# Patient Record
Sex: Female | Born: 1960 | Race: White | Hispanic: No
Health system: Southern US, Community
[De-identification: ages and names within clinical notes are randomized; demographics above are authoritative.]

---

## 2010-04-26 ENCOUNTER — Inpatient Hospital Stay: Payer: Self-pay | Admitting: Psychiatry

## 2010-05-31 ENCOUNTER — Emergency Department (HOSPITAL_COMMUNITY): Admission: EM | Admit: 2010-05-31 | Discharge: 2010-05-31 | Payer: Self-pay | Admitting: Emergency Medicine

## 2010-10-09 ENCOUNTER — Inpatient Hospital Stay: Payer: Self-pay | Admitting: Psychiatry

## 2010-10-21 LAB — URINALYSIS, ROUTINE W REFLEX MICROSCOPIC
Glucose, UA: NEGATIVE mg/dL
Protein, ur: NEGATIVE mg/dL
Specific Gravity, Urine: 1.012 (ref 1.005–1.030)
Urobilinogen, UA: 0.2 mg/dL (ref 0.0–1.0)

## 2010-10-21 LAB — DIFFERENTIAL
Eosinophils Absolute: 0.7 10*3/uL (ref 0.0–0.7)
Eosinophils Relative: 7 % — ABNORMAL HIGH (ref 0–5)
Monocytes Absolute: 1.2 10*3/uL — ABNORMAL HIGH (ref 0.1–1.0)

## 2010-10-21 LAB — BASIC METABOLIC PANEL
BUN: 15 mg/dL (ref 6–23)
Chloride: 105 mEq/L (ref 96–112)
Glucose, Bld: 105 mg/dL — ABNORMAL HIGH (ref 70–99)
Potassium: 3.9 mEq/L (ref 3.5–5.1)

## 2010-10-21 LAB — CBC
HCT: 39.6 % (ref 36.0–46.0)
MCH: 30.8 pg (ref 26.0–34.0)
MCHC: 32.6 g/dL (ref 30.0–36.0)
MCV: 94.5 fL (ref 78.0–100.0)
RDW: 14.1 % (ref 11.5–15.5)

## 2010-10-21 LAB — RAPID URINE DRUG SCREEN, HOSP PERFORMED
Amphetamines: NOT DETECTED
Barbiturates: NOT DETECTED
Benzodiazepines: NOT DETECTED

## 2010-10-21 LAB — URINE MICROSCOPIC-ADD ON

## 2011-02-07 DEATH — deceased

## 2011-08-29 IMAGING — CT CT HEAD WITHOUT CONTRAST
2 series · 16 of 30 positions shown, 20 images · non-contrast
Comparison: none

REASON FOR EXAM: altered mental status, possibly early dementia
COMMENTS:

PROCEDURE:     CT  - CT HEAD WITHOUT CONTRAST  - May 06, 2010 [DATE]
RESULT:
TECHNIQUE: Helical 5 mm sections were obtained from the skull base to the
vertex without the administration of intravenous contrast.

[Series 2: without · axial · non-contrast · 0.42mm/px · z∈[+1158,+1278]mm · 13 of 29 slices shown, 17 images]
[im 3/29  brain]
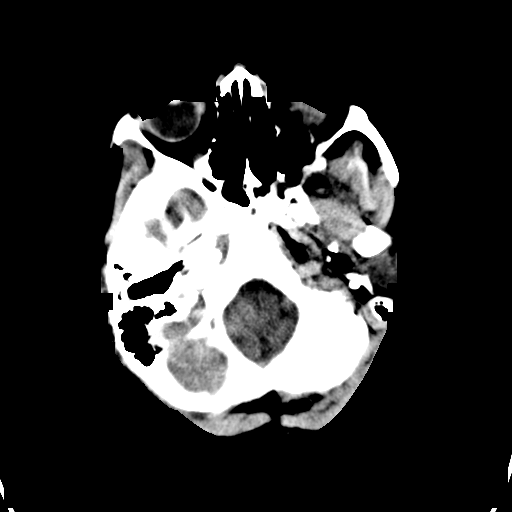
[im 3/29  bone]
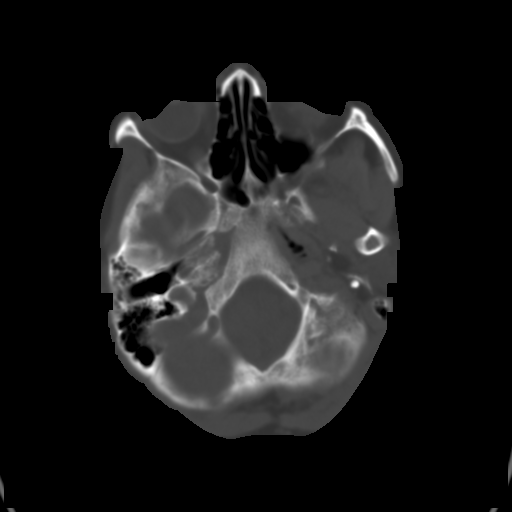
[im 5/29  brain]
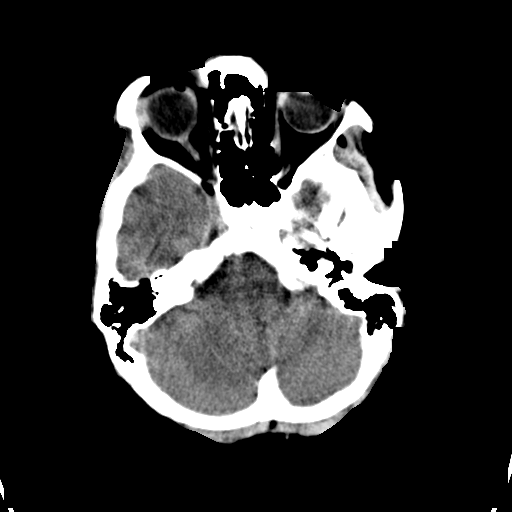
[im 7/29  brain]
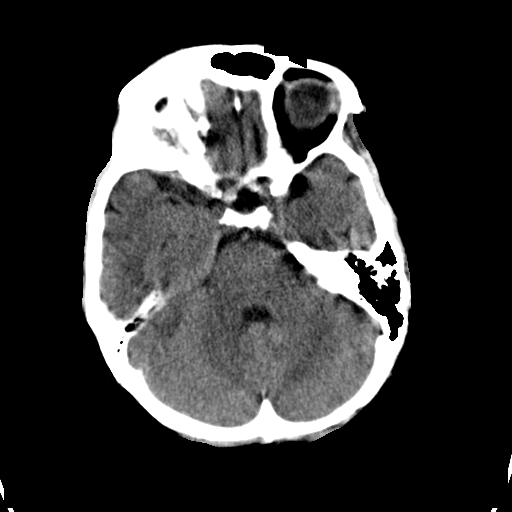
[im 9/29  brain]
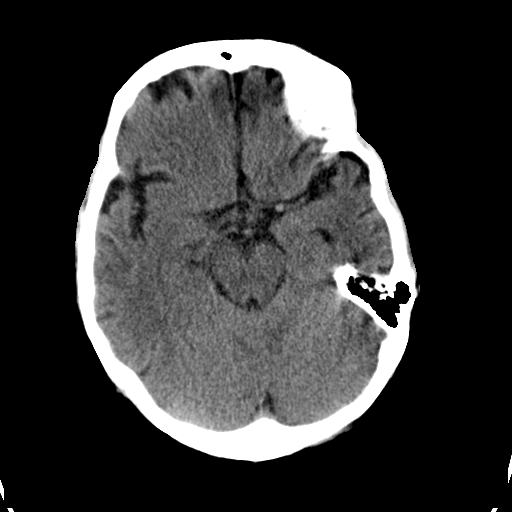
[im 11/29  brain]
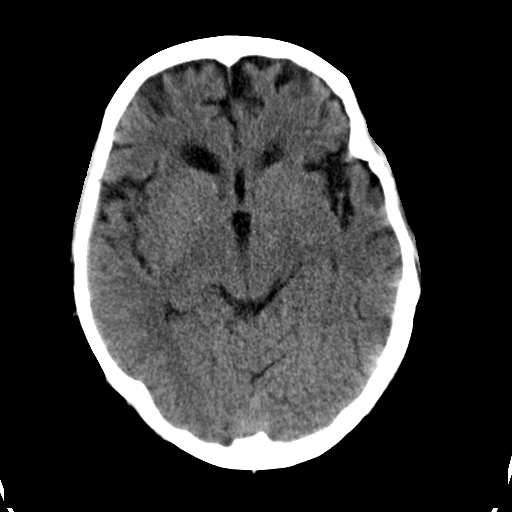
[im 11/29  bone]
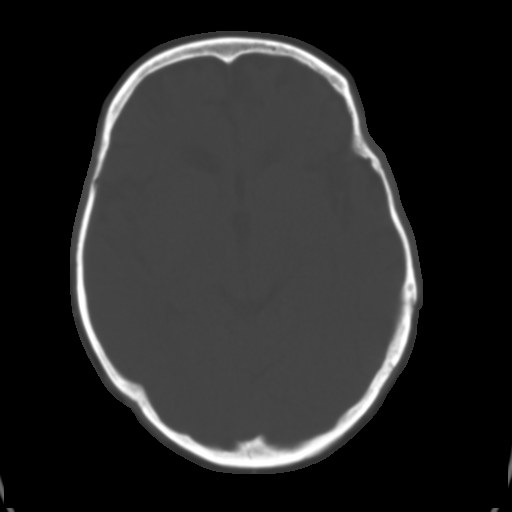
[im 13/29  brain]
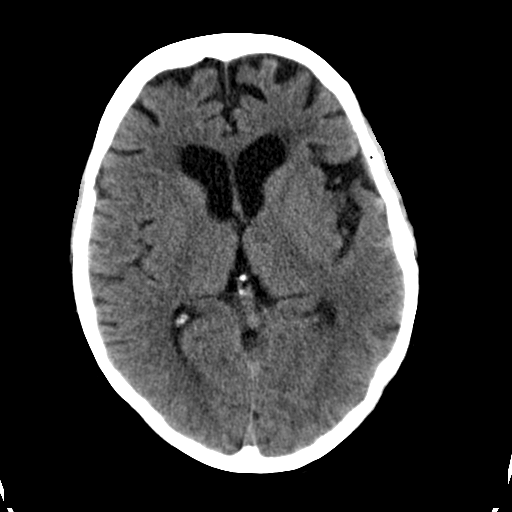
[im 15/29  brain]
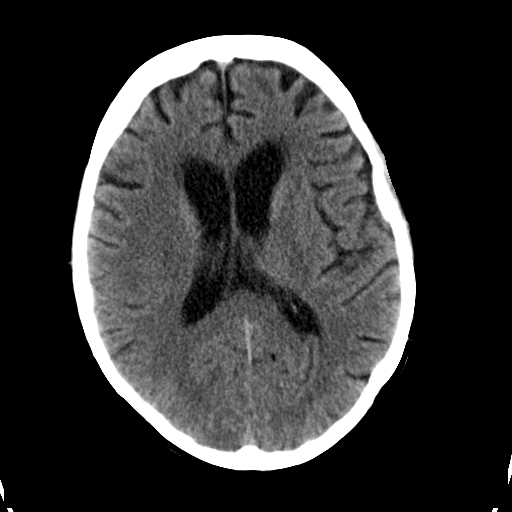
[im 17/29  brain]
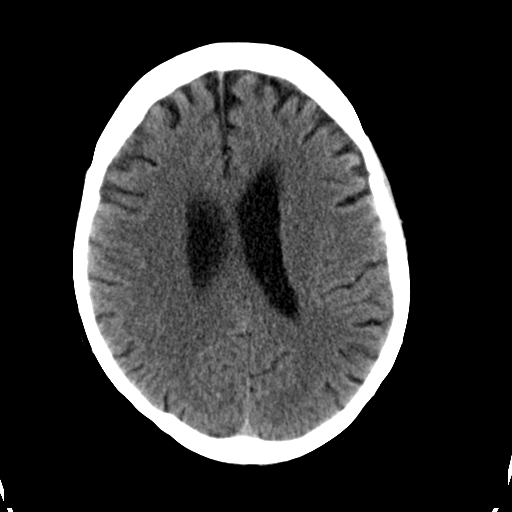
[im 19/29  brain]
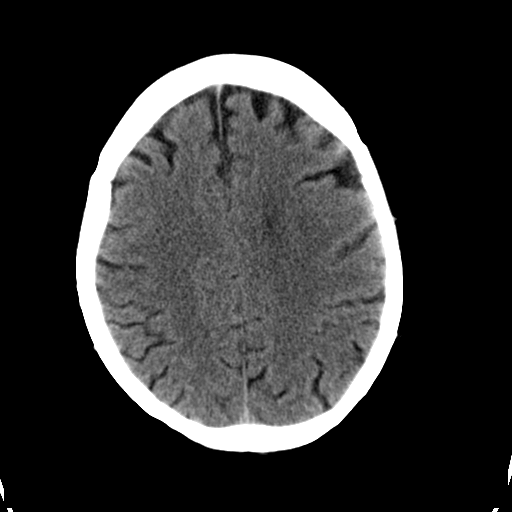
[im 19/29  bone]
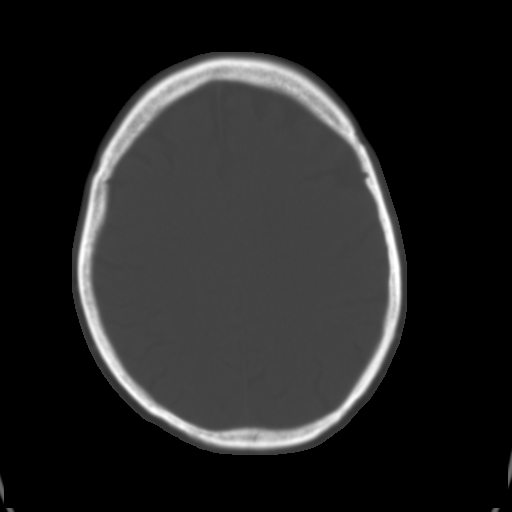
[im 21/29  brain]
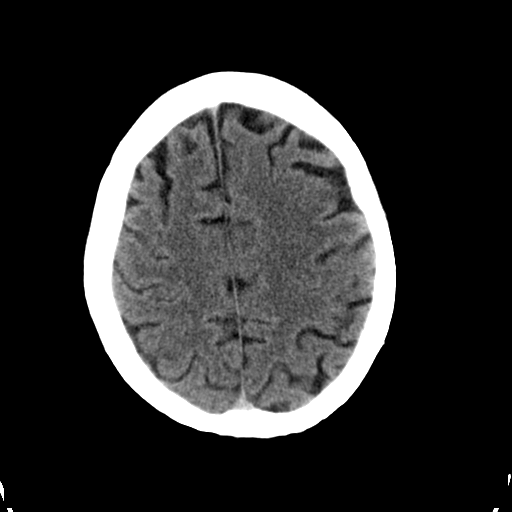
[im 23/29  brain]
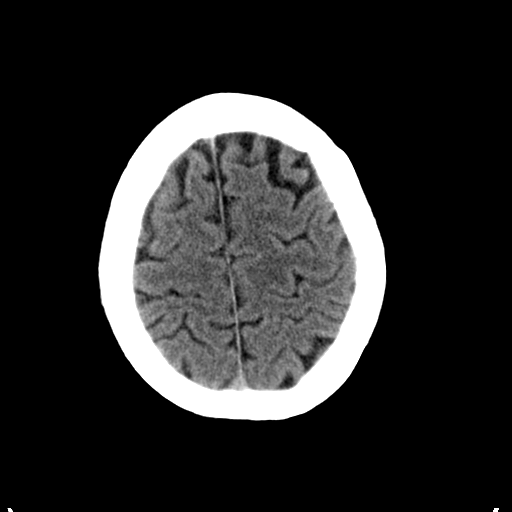
[im 25/29  brain]
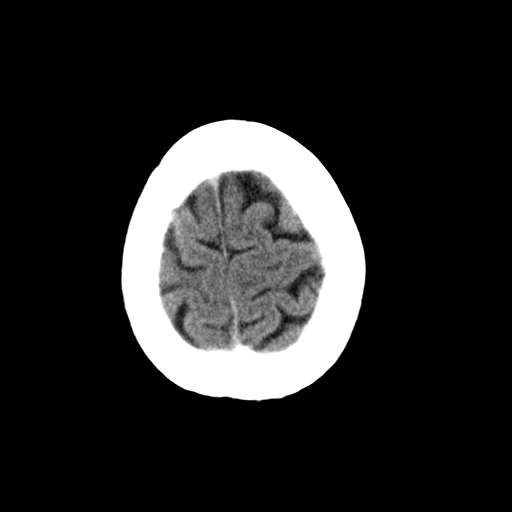
[im 27/29  brain]
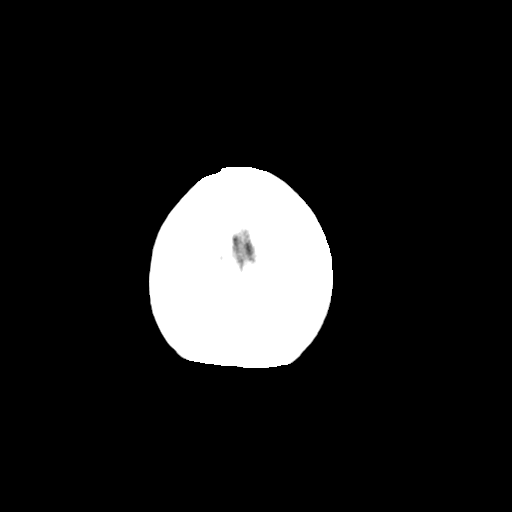
[im 27/29  bone]
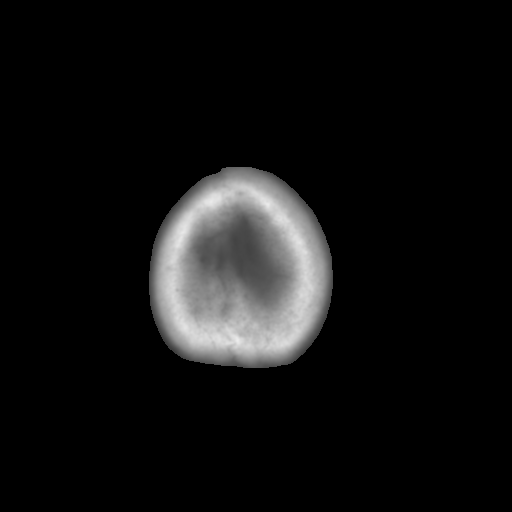

[Series 3: bone · axial · 0.42mm/px · z∈[+1158,+1198]mm · 3 of 29 slices shown]
[im 3/29  bone]
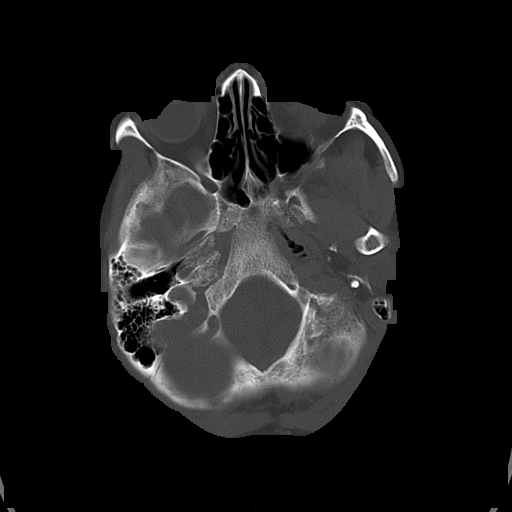
[im 7/29  bone]
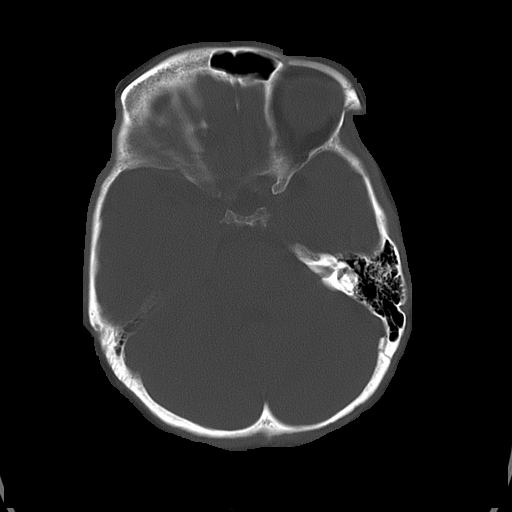
[im 11/29  bone]
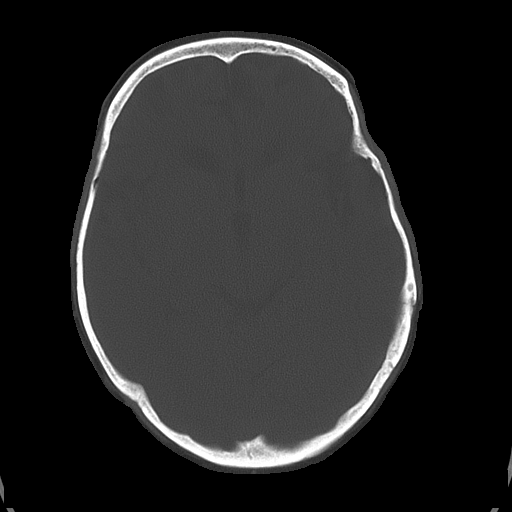

[16 of 30 positions shown; findings below may reference images not displayed]

FINDINGS: There is no evidence of intra-axial or extra-axial fluid
collections or evidence of acute hemorrhage. There is no evidence of mass
effect. The ventricles and cisterns are patent. Gross evaluation of the
cerebellum and pons is unremarkable. There is partial opacification of the
right maxillary sinus.
IMPRESSION: No evidence of focal or acute abnormalities. If there is persistent clinical
concern, further evaluation with MRI is recommended.

## 2012-01-30 IMAGING — CT CT HEAD WITHOUT CONTRAST
2 series · 16 of 30 positions shown, 20 images · non-contrast
Comparison: none

REASON FOR EXAM: ams
COMMENTS:

PROCEDURE:     CT  - CT HEAD WITHOUT CONTRAST  - October 07, 2010  [DATE]
RESULT:     Comparison:  05/06/2010
TECHNIQUE: Multiple axial images from the foramen magnum to the vertex were
obtained without IV contrast.

[Series 2: without · axial · non-contrast · 0.40mm/px · z∈[+942,+1072]mm · 13 of 32 slices shown, 17 images]
[im 3/32  brain]
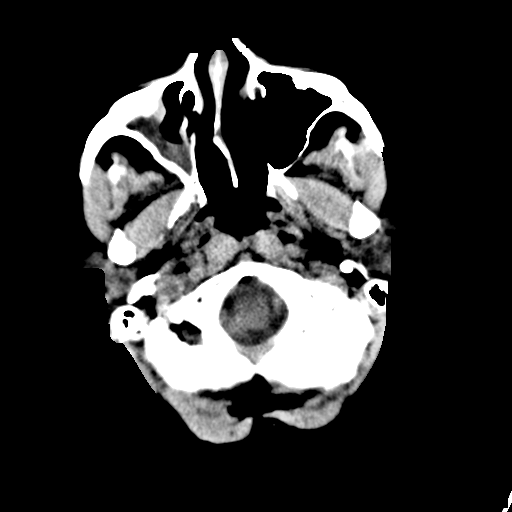
[im 3/32  bone]
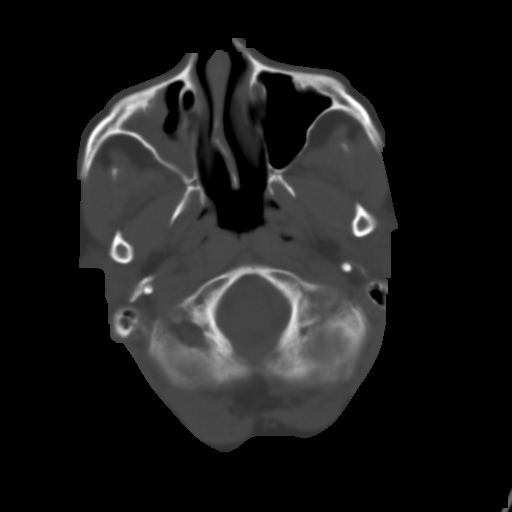
[im 5/32  brain]
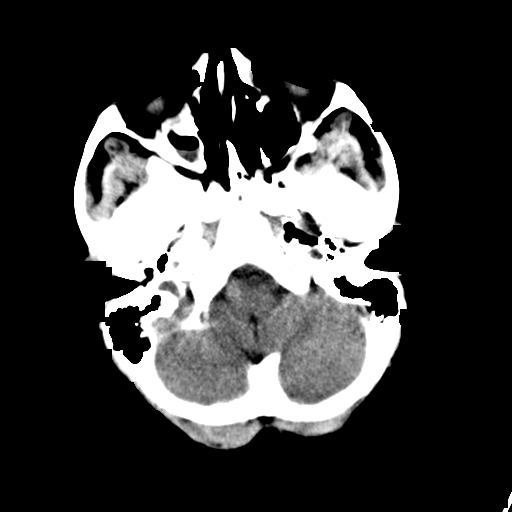
[im 7/32  brain]
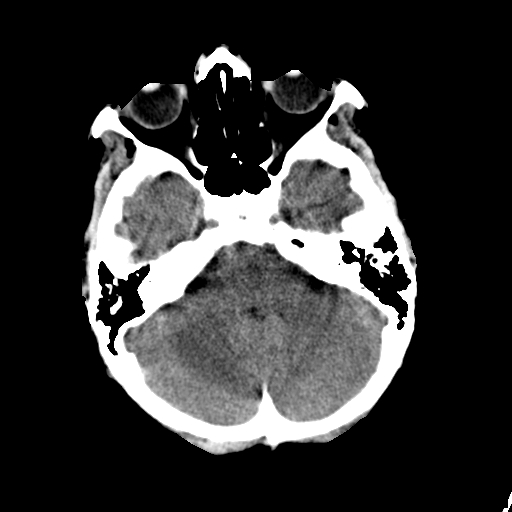
[im 9/32  brain]
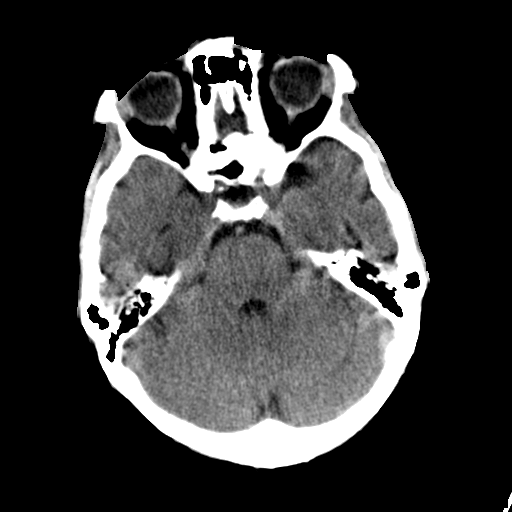
[im 12/32  brain]
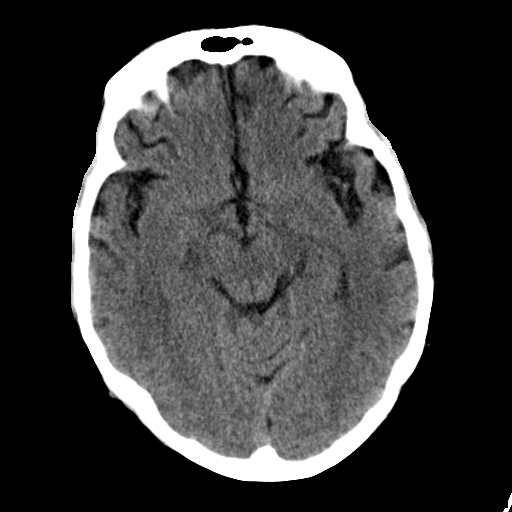
[im 12/32  bone]
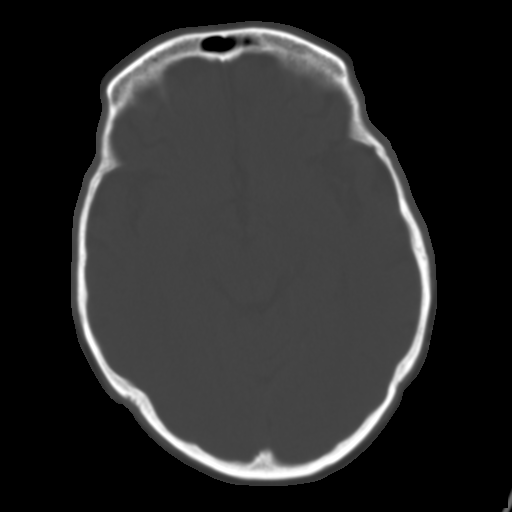
[im 14/32  brain]
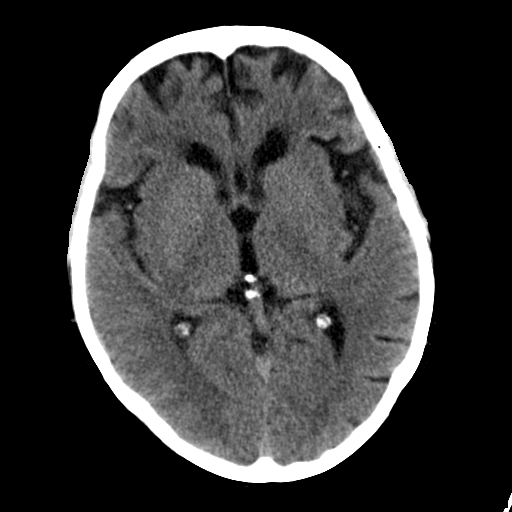
[im 16/32  brain]
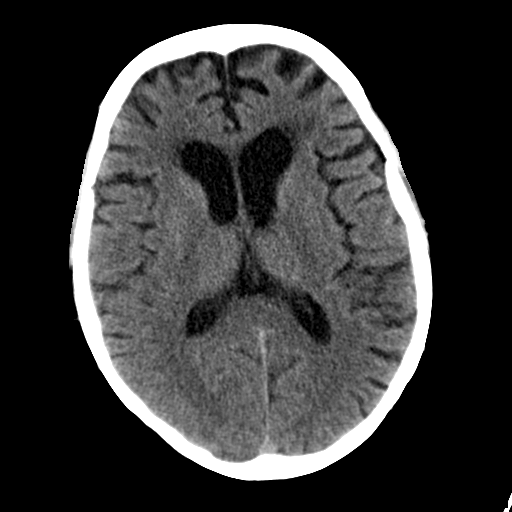
[im 18/32  brain]
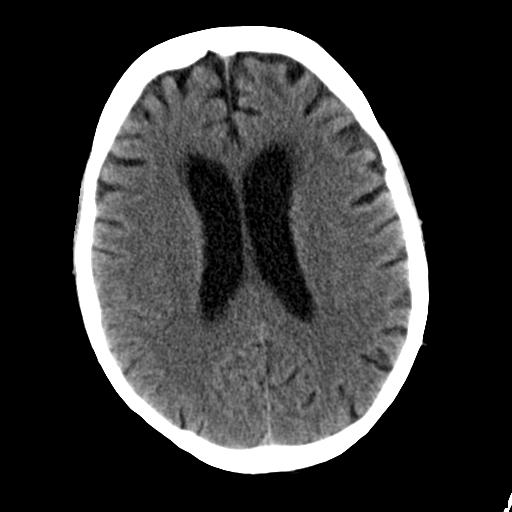
[im 20/32  brain]
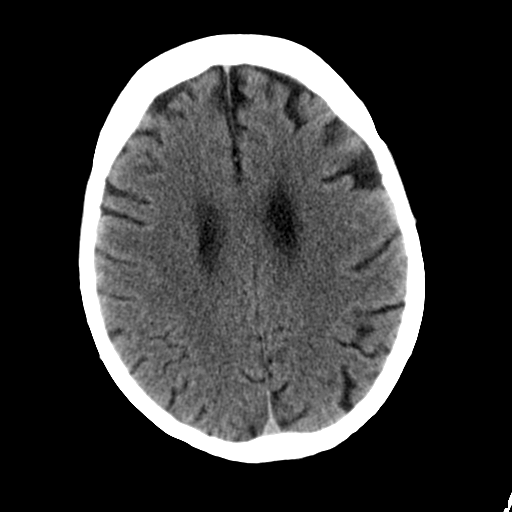
[im 20/32  bone]
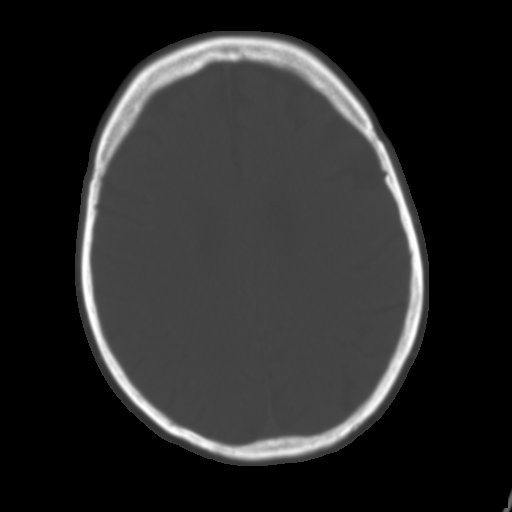
[im 23/32  brain]
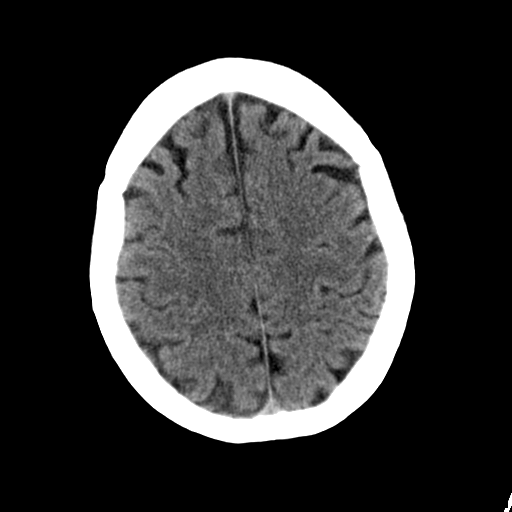
[im 25/32  brain]
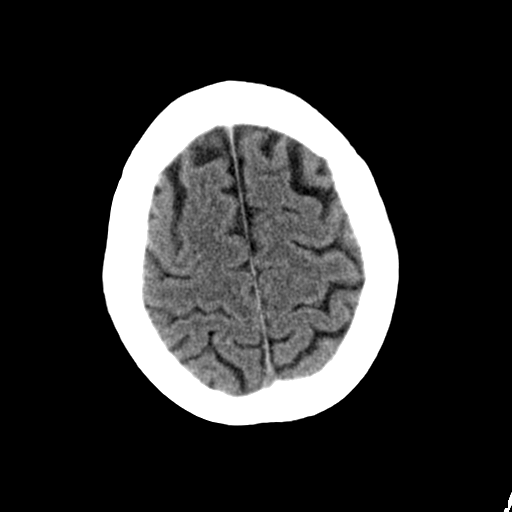
[im 27/32  brain]
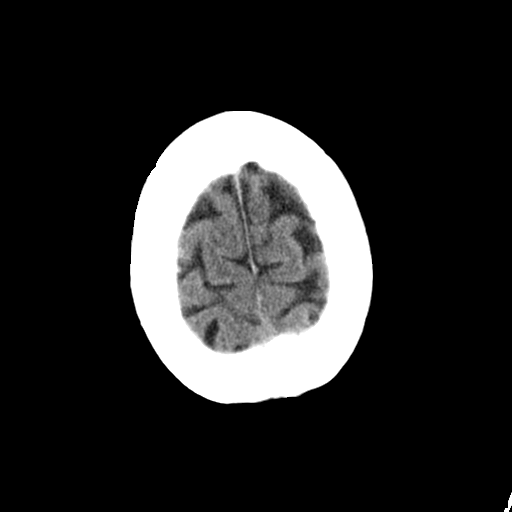
[im 29/32  brain]
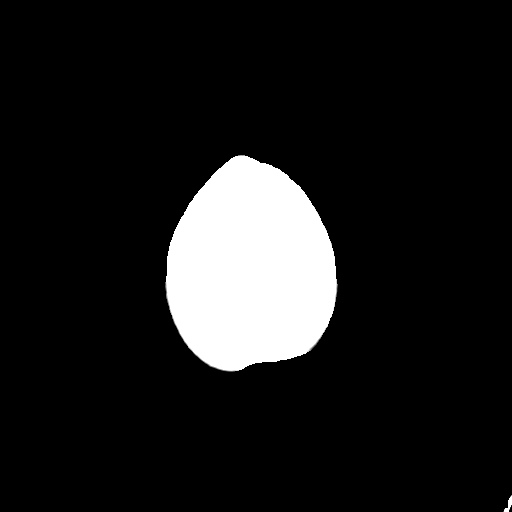
[im 29/32  bone]
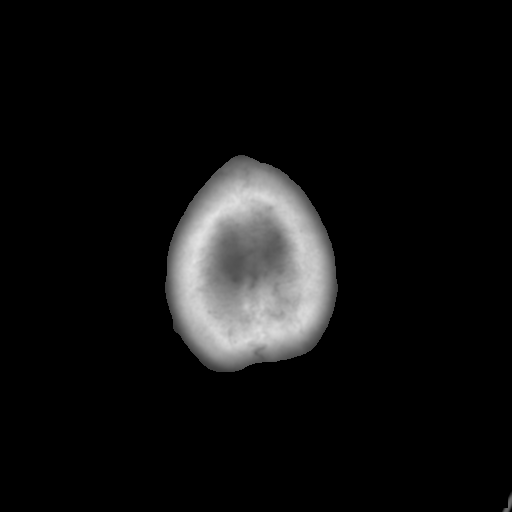

[Series 3: bone · axial · 0.40mm/px · z∈[+942,+982]mm · 3 of 31 slices shown]
[im 3/31  bone]
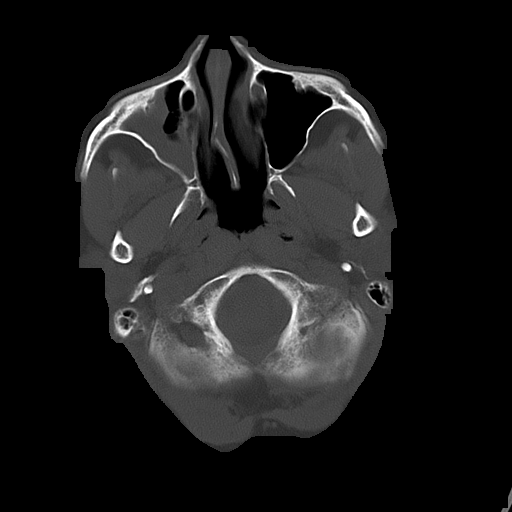
[im 7/31  bone]
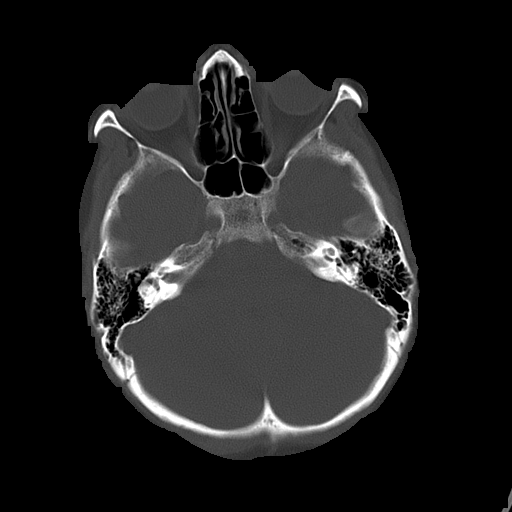
[im 11/31  bone]
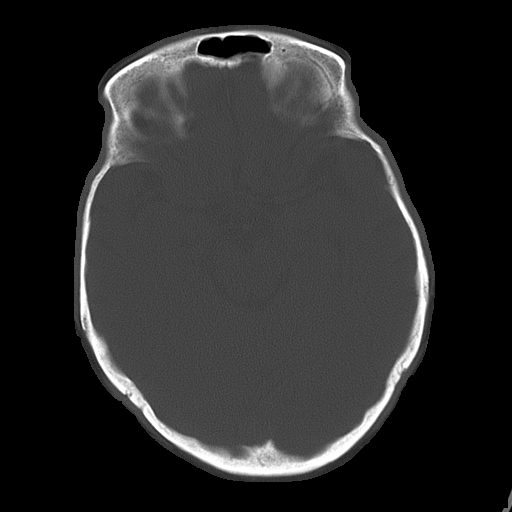

[16 of 30 positions shown; findings below may reference images not displayed]

FINDINGS: There is no evidence for mass effect, midline shift, or extra-axial fluid
collections. There is no evidence for space-occupying lesion, intracranial
hemorrhage, or cortical-based area of infarction. Mild periventricular
hypoattenuation likely represent sequela of chronic small vessel scheming
disease.

There is near-complete opacification of the right maxillary sinus.

The osseous structures are unremarkable.
IMPRESSION: 1. No acute intracranial process. Chronic small vessel ischemic disease.
2. Right maxillary sinus disease.

## 2017-08-16 ENCOUNTER — Encounter: Payer: Self-pay | Admitting: Pulmonary Disease

## 2017-08-16 NOTE — Telephone Encounter (Deleted)
Death cert given to DK to fill out. 

## 2017-08-16 NOTE — Telephone Encounter (Signed)
This encounter was created in error - please disregard.

## 2017-08-16 NOTE — Telephone Encounter (Signed)
Recieved Death Certificate from [employer], [facility] [place]
